# Patient Record
Sex: Female | Born: 1955 | Race: Black or African American | Hispanic: No | Marital: Single | Smoking: Current every day smoker
Health system: Southern US, Community
[De-identification: ages and names within clinical notes are randomized; demographics above are authoritative.]

## PROBLEM LIST (undated history)

## (undated) DIAGNOSIS — G459 Transient cerebral ischemic attack, unspecified: Secondary | ICD-10-CM

## (undated) DIAGNOSIS — I10 Essential (primary) hypertension: Secondary | ICD-10-CM

## (undated) DIAGNOSIS — E785 Hyperlipidemia, unspecified: Secondary | ICD-10-CM

## (undated) DIAGNOSIS — E119 Type 2 diabetes mellitus without complications: Secondary | ICD-10-CM

## (undated) HISTORY — PX: ABDOMINAL HYSTERECTOMY: SHX81

---

## 2007-07-19 ENCOUNTER — Ambulatory Visit: Payer: Self-pay

## 2008-09-19 ENCOUNTER — Ambulatory Visit: Payer: Self-pay

## 2008-10-05 ENCOUNTER — Ambulatory Visit: Payer: Self-pay

## 2010-01-09 ENCOUNTER — Ambulatory Visit: Payer: Self-pay | Admitting: Family Medicine

## 2011-01-21 ENCOUNTER — Ambulatory Visit: Payer: Self-pay | Admitting: Family Medicine

## 2012-02-24 ENCOUNTER — Ambulatory Visit: Payer: Self-pay | Admitting: Family Medicine

## 2012-05-05 ENCOUNTER — Emergency Department: Payer: Self-pay | Admitting: Emergency Medicine

## 2012-05-05 LAB — URINALYSIS, COMPLETE
Bilirubin,UR: NEGATIVE
Blood: NEGATIVE
Hyaline Cast: 5
Ketone: NEGATIVE
Nitrite: NEGATIVE
Ph: 6 (ref 4.5–8.0)
Squamous Epithelial: 6
WBC UR: 8 /HPF (ref 0–5)

## 2012-05-05 LAB — CBC
HGB: 14.1 g/dL (ref 12.0–16.0)
MCH: 27.7 pg (ref 26.0–34.0)
MCHC: 33.6 g/dL (ref 32.0–36.0)
RBC: 5.07 10*6/uL (ref 3.80–5.20)

## 2012-05-05 LAB — BASIC METABOLIC PANEL
Anion Gap: 10 (ref 7–16)
BUN: 17 mg/dL (ref 7–18)
Calcium, Total: 9.7 mg/dL (ref 8.5–10.1)
Chloride: 108 mmol/L — ABNORMAL HIGH (ref 98–107)
Co2: 24 mmol/L (ref 21–32)
Creatinine: 1.13 mg/dL (ref 0.60–1.30)
EGFR (African American): >60
Osmolality: 285 (ref 275–301)
Sodium: 142 mmol/L (ref 136–145)

## 2012-05-12 ENCOUNTER — Emergency Department: Payer: Self-pay | Admitting: Emergency Medicine

## 2013-03-22 ENCOUNTER — Ambulatory Visit: Payer: Self-pay

## 2014-04-20 LAB — COMPREHENSIVE METABOLIC PANEL
ALBUMIN: 3.7 g/dL (ref 3.4–5.0)
Alkaline Phosphatase: 95 U/L
Anion Gap: 8 (ref 7–16)
BILIRUBIN TOTAL: 0.3 mg/dL (ref 0.2–1.0)
BUN: 16 mg/dL (ref 7–18)
CALCIUM: 9.2 mg/dL (ref 8.5–10.1)
CHLORIDE: 108 mmol/L — AB (ref 98–107)
Co2: 24 mmol/L (ref 21–32)
Creatinine: 1 mg/dL (ref 0.60–1.30)
EGFR (African American): >60
EGFR (Non-African Amer.): >60
Glucose: 94 mg/dL (ref 65–99)
Osmolality: 280 (ref 275–301)
POTASSIUM: 4 mmol/L (ref 3.5–5.1)
SGOT(AST): 26 U/L (ref 15–37)
SGPT (ALT): 26 U/L
Sodium: 140 mmol/L (ref 136–145)
TOTAL PROTEIN: 8.2 g/dL (ref 6.4–8.2)

## 2014-04-20 LAB — CBC WITH DIFFERENTIAL/PLATELET
BASOS PCT: 0.5 %
Basophil #: 0 10*3/uL (ref 0.0–0.1)
EOS PCT: 1.2 %
Eosinophil #: 0.1 10*3/uL (ref 0.0–0.7)
HCT: 40.5 % (ref 35.0–47.0)
HGB: 13.4 g/dL (ref 12.0–16.0)
LYMPHS ABS: 4.5 10*3/uL — AB (ref 1.0–3.6)
Lymphocyte %: 53.2 %
MCH: 27.5 pg (ref 26.0–34.0)
MCHC: 33 g/dL (ref 32.0–36.0)
MCV: 84 fL (ref 80–100)
MONO ABS: 0.7 x10 3/mm (ref 0.2–0.9)
Monocyte %: 8.2 %
Neutrophil #: 3.1 10*3/uL (ref 1.4–6.5)
Neutrophil %: 36.9 %
Platelet: 191 10*3/uL (ref 150–440)
RBC: 4.85 10*6/uL (ref 3.80–5.20)
RDW: 14.4 % (ref 11.5–14.5)
WBC: 8.4 10*3/uL (ref 3.6–11.0)

## 2014-04-20 LAB — APTT: Activated PTT: 29.4 secs (ref 23.6–35.9)

## 2014-04-20 LAB — TROPONIN I

## 2014-04-20 LAB — PROTIME-INR
INR: 1
Prothrombin Time: 13.5 secs (ref 11.5–14.7)

## 2014-04-21 ENCOUNTER — Inpatient Hospital Stay: Payer: Self-pay | Admitting: Internal Medicine

## 2014-04-21 LAB — HEMOGLOBIN A1C: Hemoglobin A1C: 6 % (ref 4.2–6.3)

## 2014-04-22 ENCOUNTER — Ambulatory Visit: Payer: Self-pay | Admitting: Neurology

## 2014-04-22 LAB — LIPID PANEL
CHOLESTEROL: 168 mg/dL (ref 0–200)
HDL: 28 mg/dL — AB (ref 40–60)
LDL CHOLESTEROL, CALC: 103 mg/dL — AB (ref 0–100)
TRIGLYCERIDES: 185 mg/dL (ref 0–200)
VLDL Cholesterol, Calc: 37 mg/dL (ref 5–40)

## 2014-06-27 ENCOUNTER — Ambulatory Visit: Payer: Self-pay | Admitting: Family Medicine

## 2014-07-28 ENCOUNTER — Emergency Department
Admit: 2014-07-28 | Disposition: A | Discharge status: Home / Self Care | Payer: Self-pay | Admitting: Physician Assistant

## 2014-08-15 NOTE — H&P (Signed)
PATIENT NAME:  Judith Parker, Judith Parker MR#:  [id number] DATE OF BIRTH:  Sep 15, 1955  DATE OF ADMISSION:  04/21/2014  REFERRING PHYSICIAN: Lurena Joiner L. Shaune Pollack, MD  PRIMARY CARE PHYSICIAN: [facility].  CHIEF COMPLAINT: Left-sided weakness.   HISTORY OF PRESENT ILLNESS: A 59 year old [place] American female with a past medical history of essential hypertension; hyperlipidemia, unspecified; type 2 diabetes, non-insulin-requiring, uncomplicated; presenting with left-sided weakness. Describes acute onset of left-sided weakness about 24 to 48 hours prior to arrival to the hospital, mainly in the upper extremity and lower extremity with associated paresthesias. Symptoms resolved by the time she presented to the hospital; however, at urging of her family, decided to present to the hospital for further workup and evaluation. Now complaining of 1 day duration of right frontal headache described only as "pain," intensity 5 to 6/10, nonradiating, worse with bright lights. No relieving factors.  REVIEW OF SYSTEMS: CONSTITUTIONAL: Denies fevers, chills, fatigue, weakness.  EYES: Denies blurred vision, double vision, eye pain.  EARS, NOSE, THROAT: Denies tinnitus, ear pain, hearing loss.  RESPIRATORY: Denies cough, wheeze, shortness of breath.  CARDIOVASCULAR: Denies chest pain, palpitations, edema.  GASTROINTESTINAL: Denies nausea, vomiting, diarrhea, abdominal pain.  GENITOURINARY: Denies dysuria, hematuria.  ENDOCRINE: Denies nocturia or thyroid problems. HEMATOLOGY AND LYMPHATIC: Denies easy bruising and bleeding.  SKIN: Denies rashes or lesions.  MUSCULOSKELETAL: Denies pain in neck, back, shoulder, knees, hips, or arthritic symptoms.  NEUROLOGIC: Denies paralysis, paresthesias. However, positive for weakness as described above as well as paresthesias as described above. However, these have all improved now.  PSYCHIATRIC: Denies any anxiety or depressive symptoms.  Otherwise, full review of systems  performed by me is negative.   PAST MEDICAL HISTORY: Essential hypertension; hyperlipidemia, unspecified; type 2 diabetes, non-insulin-requiring, uncomplicated.   SOCIAL HISTORY: Positive for everyday tobacco use as well as occasional alcohol use. Denies any drug use.   FAMILY HISTORY: Denies any known cardiovascular or pulmonary disorders.   ALLERGIES: No known drug allergies.   HOME MEDICATIONS: Include aspirin 81 mg p.o. q. daily; lisinopril 10 mg p.o. q. daily; gemfibrozil 600 mg p.o. b.i.d.; fish oil, 1 capsule p.o. q. daily; Centrum Silver multivitamin, 1 tab p.o. q. daily. Of note, she has been out of her blood pressure medications for 5 days.   PHYSICAL EXAMINATION:  VITAL SIGNS: Temperature 98.4, heart rate 73, respirations 20, blood pressure 179/75, saturating 96% on room air. Weight 97.1 kg, BMI of 31.6.  GENERAL: Well-nourished, well-developed, Caucasian female currently in no acute distress.  HEAD: Normocephalic, atraumatic.  EYES: Pupils equal, round, reactive to light. Extraocular muscles intact. No scleral icterus.  MOUTH: Moist mucosal membranes. Dentition intact. No abscess noted. EARS, NOSE, THROAT: Clear without exudates. No external lesions.  NECK: Supple. No thyromegaly. No nodules. No JVD.  PULMONARY: Clear to auscultation bilaterally without wheezes, rales, or rhonchi. No use of accessory muscles. Good respiratory effort. CHEST: Nontender to palpation.  CARDIOVASCULAR: S1, S2, regular rate and rhythm. No murmurs, rubs, or gallops. No edema. Pedal pulses 2+ bilaterally. GASTROINTESTINAL: Soft, nontender, nondistended. No masses. No hepatosplenomegaly.  MUSCULOSKELETAL: No swelling, clubbing, or edema. Range of motion full in all extremities.  NEUROLOGIC: Cranial nerves II through XII intact. No gross focal neurological deficits. Sensation intact. Reflexes intact. pronator drift within normal limits. Strength 5/5 in both proximal and distal flexion and extension in all  extremities.  SKIN: No ulceration, lesions, rashes, or cyanosis. Skin warm, dry. Turgor intact.  PSYCHIATRIC: Mood, affect within normal limits. Patient awake, alert, oriented x 3. Insight  and judgment intact.   LABORATORY DATA: CT, head, performed reveals subacute chronic right basal ganglia lacunar infarct. Remainder of laboratory data: Sodium 140, potassium 4, chloride of 108, bicarbonate 24, BUN 16, creatinine 1. LFTs within normal limits. WBC 8.4, hemoglobin 13.4, platelets of 191,000.   ASSESSMENT AND PLAN: A 59 year old [place] American female with a history of hypertension; hyperlipidemia; type 2 diabetes, non-insulin-requiring; presenting with left-sided weakness.  1.  Subacute right basal ganglia lacunar infarct. Initiate aspirin and statin therapy. Increase aspirin dose to 325 instead of 81 mg. Neurologic checks q. 4 hours. Will get an MRI, carotid Dopplers, lipid panel, hemoglobin A1c, transthoracic echocardiogram, and searching for further risk-factor modification.  2.  Type 2 diabetes, uncomplicated. Hold p.o. agents. Add insulin sliding scale with q. 6 hour Accu-Cheks.  3.  Hypertension, essential. Restart her home medications.  4.  Venous thromboembolism prophylaxis with sequential compression devices.  CODE STATUS: The patient is full code.   TIME SPENT: 45 minutes.    ____________________________ Cletis Athensavid K. Katharina Jehle, MD dkh:ST D: 04/21/2014 01:39:56 ET T: 04/21/2014 01:59:18 ET JOB#: [id number]  cc: Cletis Athensavid K. Isabeau Mccalla, MD, <Dictator> Makyiah Lie Synetta ShadowK Karaline Buresh MD ELECTRONICALLY SIGNED 04/21/2014 22:11

## 2014-08-19 NOTE — Consult Note (Signed)
PATIENT NAME:  Judith Parker, Judith Parker MR#:  [id number] DATE OF BIRTH:  January 12, 1956  DATE OF CONSULTATION:  04/22/2014  REFERRING PHYSICIAN:   CONSULTING PHYSICIAN:  Pauletta BrownsYuriy Joandry Slagter, MD  REASON FOR CONSULTATION: Stroke.   HISTORY OF PRESENT ILLNESS: This is a 59 year old female with a past medical history of hypertension, hyperlipidemia and type 2 diabetes who is presenting with left-sided weakness. The patient was found to have right basal ganglia and multiple right MCA embolic infarcts. The patient is status post CTA and was found to have distal MCA stenosis on the right, status post carotid ultrasound with no hemodynamic stenosis. Currently denies any fever or chills on review of systems. Denies any blurred vision. Denies any tinnitus. Denies any chest pain. Denies any abdominal pain. Denies any weakness on one side of the body compared to the other.   HOME MEDICATIONS: Reviewed.   PAST MEDICAL HISTORY: Hypertension, hyperlipidemia, type 2 diabetes.   PHYSICAL EXAMINATION:  NEUROLOGIC: The patient is alert, awake, oriented to time, place, location, the reason why she is in the hospital. Facial sensation intact. Facial motor is intact. Tongue is midline. Uvula elevates symmetrically. Shoulder shrug intact. Motor 5/5 bilateral upper and lower extremities. Extraocular movements intact. Sensation is intact. The patient is close to her baseline.   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   PLAN: Dual antiplatelet therapy with aspirin and Plavix for 90 days and then monotherapy. Follow up with neurology as an outpatient. Discharge planning from a neurological standpoint.   Thank you. It was a pleasure seeing this patient.    ____________________________ Pauletta BrownsYuriy Shoaib Siefker, MD yz:JT D: 04/22/2014 13:22:24 ET T: 04/22/2014 15:44:16 ET JOB#: [id number]  cc: Pauletta BrownsYuriy Jalexis Breed, MD, <Dictator> Pauletta BrownsYURIY Tyreka Henneke MD ELECTRONICALLY  SIGNED 05/08/2014 21:10

## 2014-08-19 NOTE — Discharge Summary (Signed)
PATIENT NAME:  Judith Parker, Judith Parker MR#:  [id number] DATE OF BIRTH:  [birth year]  DATE OF ADMISSION:  04/21/2014 DATE OF DISCHARGE:  04/22/2014  CHIEF COMPLAINT AT THE TIME OF ADMISSION: Left-sided weakness.   ADMITTING DIAGNOSES: 1. Subacute right basal ganglia lacunar infarct.  2. Diabetes type 2.  3. Hypertension.   DISCHARGE DIAGNOSES: 1. Acute right middle cerebral artery embolic stroke.  2. Hyperlipidemia.   PROCEDURES: None.   CONSULTATIONS: Neurology, Pauletta Browns, MD   HOSPITAL COURSE: The patient is a 59 year old African American female who came in to the ED with a chief complaint of left-sided weakness. She has multiple risk factors. She was describing left-sided weakness for about 24 to 48 hours prior to her arrival to the hospital. Please review history and physical for details. The patient was also complaining of right-sided frontal headache 5 out of 10 to 6 out of 10, nonradiating, worse with bright lights. CAT scan of the head has revealed a subacute right basal ganglia lacunar infarct. The patient was started on aspirin and statin. Neurologic checks were ordered. MRA of the brain, carotid Dopplers, and 2-D echocardiogram were ordered.   MRA the brain has revealed a few acute subcentimeter infarctions in the right middle cerebral artery consistent with micro-embolic infarctions with no hemorrhage, swelling or mass effect. This was discussed with the on-call neurologist, Dr. Loretha Brasil, regarding the need for TEE, transesophageal echocardiogram. Dr. Loretha Brasil has evaluated this patient. The patient's symptoms have resolved. The patient was found to have distal MCA stenosis in the right status post carotid ultrasound with no hemodynamic stenosis. The patient denies any chest pain, tinnitus, blurry vision, or any weakness. He has recommended to discharge the patient with aspirin and Plavix 75 mg for 90 days, and then subsequently monotherapy and follow up with neurology as an outpatient.  The patient felt comfortable to be discharged home. She was evaluated by physical therapy and since she was able to take shower without any assistance, they have recommended no physical therapy needs. She was discharged to home in stable condition.   PHYSICAL EXAMINATION: VITAL SIGNS: On 04/22/2014: Temperature 97.5, pulse 58, respirations 18, blood pressure 146/77, pulse oximetry 97%. GENERAL: Patient was not in any acute distress. Moderately built and nourished.  HEENT: Normocephalic, atraumatic. Pupils are equally reactive to light and accommodation. No scleral icterus. No conjunctival injection. No sinus tenderness. No postnasal drip. Moist mucous membranes.  NECK: Supple. No JVD. No thyromegaly. Range of motion is intact.  LUNGS: Clear to auscultation bilaterally. No accessory muscle use and no anterior chest wall tenderness on palpation.  CARDIAC: S1, S2 normal. Regular rate and rhythm. No murmurs.  GASTROINTESTINAL: Soft. Bowel sounds are positive in all 4 quadrants. Nontender, nondistended. No hepatosplenomegaly. No masses felt.  NEUROLOGIC: Awake, alert, oriented x3. Cranial nerves II through XII are grossly intact. Motor and sensory are intact. Reflexes are 2+. Gait normal.  EXTREMITIES: No cyanosis. No clubbing.  SKIN: Warm to touch. Normal turgor. No rashes. No lesions.  MUSCULOSKELETAL: No joint effusion, tenderness or erythema.  PSYCHIATRIC: Normal mood and affect.   LABORATORY DATA AND IMAGING STUDIES: MRI of the brain: A few acute subcentimeter infarctions in the right middle cerebral artery territory consistent with micro-embolic infarction. No hemorrhage. No swelling or mass effect. Chronic small vessel ischemic changes throughout the brain. Echocardiogram: Left ventricular ejection fraction of 60%  to 65%. Normal global left ventricular systolic function. Mildly increased LV septal thickness and ventricular posterior wall thickness. CT angiogram of the head has revealed  moderate  attenuation of the distal right MCA branch versus worse on the right. Corresponds to the area of acute infarct. Mild distal small vessel disease. Acute punctate cortical infarct in the high posterior right frontal lobe. LDL 103 as of 04/22/2014.   She is full code.   DISCHARGE MEDICATIONS: Bayer aspirin 81 mg p.o. once daily,  atorvastatin 20 mg p.o. once daily, lisinopril 10 mg 1 pill p.o. once daily, fish oil 1 capsule p.o. once daily, Centrum Silver 1 tablet p.o. once daily, gemfibrozil 600 mg p.o. 2 times a day.   Low sodium, low fat diet, and also carb-controlled diet.   ACTIVITY: As tolerated.   Follow up with primary care physician in 1 week, with neurology in 2 to 4 weeks, with outpatient diabetic clinic in 4 weeks.   The diagnosis and plan of care was discussed in detail with the patient. She verbalized understanding of the plan.   TOTAL TIME SPENT ON DISCHARGE AND COORDINATION OF CARE: 45 minutes.    ____________________________ Ramonita LabAruna Joshau Code, MD ag:lm D: 04/28/2014 19:50:44 ET T: 04/29/2014 04:35:07 ET JOB#: [id number]  cc: Ramonita LabAruna Malvin Morrish, MD, <Dictator> Pauletta BrownsYuriy Zeylikman, MD (Dictation Anomaly) Ramonita LabARUNA Herschel Fleagle MD ELECTRONICALLY SIGNED 04/30/2014 22:34

## 2015-11-11 ENCOUNTER — Emergency Department (HOSPITAL_COMMUNITY): Payer: Medicaid Other

## 2015-11-11 ENCOUNTER — Emergency Department (HOSPITAL_COMMUNITY)
Admission: EM | Admit: 2015-11-11 | Discharge: 2015-11-11 | Disposition: A | Discharge status: Home / Self Care | Payer: Medicaid Other | Attending: Emergency Medicine | Admitting: Emergency Medicine

## 2015-11-11 ENCOUNTER — Encounter (HOSPITAL_COMMUNITY): Payer: Self-pay

## 2015-11-11 DIAGNOSIS — E860 Dehydration: Secondary | ICD-10-CM | POA: Diagnosis not present

## 2015-11-11 DIAGNOSIS — I10 Essential (primary) hypertension: Secondary | ICD-10-CM | POA: Diagnosis not present

## 2015-11-11 DIAGNOSIS — F17210 Nicotine dependence, cigarettes, uncomplicated: Secondary | ICD-10-CM | POA: Diagnosis not present

## 2015-11-11 DIAGNOSIS — Z8673 Personal history of transient ischemic attack (TIA), and cerebral infarction without residual deficits: Secondary | ICD-10-CM | POA: Diagnosis not present

## 2015-11-11 DIAGNOSIS — Z7982 Long term (current) use of aspirin: Secondary | ICD-10-CM | POA: Diagnosis not present

## 2015-11-11 DIAGNOSIS — R55 Syncope and collapse: Secondary | ICD-10-CM | POA: Diagnosis present

## 2015-11-11 DIAGNOSIS — E119 Type 2 diabetes mellitus without complications: Secondary | ICD-10-CM | POA: Diagnosis not present

## 2015-11-11 HISTORY — DX: Essential (primary) hypertension: I10

## 2015-11-11 HISTORY — DX: Hyperlipidemia, unspecified: E78.5

## 2015-11-11 HISTORY — DX: Transient cerebral ischemic attack, unspecified: G45.9

## 2015-11-11 HISTORY — DX: Type 2 diabetes mellitus without complications: E11.9

## 2015-11-11 LAB — CBC WITH DIFFERENTIAL/PLATELET
BASOS PCT: 0 %
Basophils Absolute: 0 10*3/uL (ref 0.0–0.1)
EOS PCT: 1 %
Eosinophils Absolute: 0 10*3/uL (ref 0.0–0.7)
HEMATOCRIT: 39 % (ref 36.0–46.0)
HEMOGLOBIN: 13 g/dL (ref 12.0–15.0)
LYMPHS PCT: 36 %
Lymphs Abs: 2.9 10*3/uL (ref 0.7–4.0)
MCH: 27.6 pg (ref 26.0–34.0)
MCHC: 33.3 g/dL (ref 30.0–36.0)
MCV: 82.8 fL (ref 78.0–100.0)
MONO ABS: 0.8 10*3/uL (ref 0.1–1.0)
Monocytes Relative: 10 %
Neutro Abs: 4.2 10*3/uL (ref 1.7–7.7)
Neutrophils Relative %: 53 %
Platelets: 167 10*3/uL (ref 150–400)
RBC: 4.71 MIL/uL (ref 3.87–5.11)
RDW: 14.3 % (ref 11.5–15.5)
WBC: 8 10*3/uL (ref 4.0–10.5)

## 2015-11-11 LAB — I-STAT CHEM 8, ED
BUN: 12 mg/dL (ref 6–20)
CHLORIDE: 109 mmol/L (ref 101–111)
CREATININE: 1.2 mg/dL — AB (ref 0.44–1.00)
Calcium, Ion: 1.02 mmol/L — ABNORMAL LOW (ref 1.12–1.23)
Glucose, Bld: 97 mg/dL (ref 65–99)
HEMATOCRIT: 42 % (ref 36.0–46.0)
Hemoglobin: 14.3 g/dL (ref 12.0–15.0)
Potassium: 4.2 mmol/L (ref 3.5–5.1)
Sodium: 141 mmol/L (ref 135–145)
TCO2: 19 mmol/L (ref 0–100)

## 2015-11-11 MED ORDER — SODIUM CHLORIDE 0.9 % IV BOLUS (SEPSIS)
1000.0000 mL | Freq: Once | INTRAVENOUS | Status: AC
  Administered 2015-11-11: 1000 mL via INTRAVENOUS

## 2015-11-11 NOTE — ED Provider Notes (Signed)
[facility]-EMERGENCY DEPT Provider Note   CSN: [id number] Arrival date & time: 11/11/15  1217  First Provider Contact:  First MD Initiated Contact with Patient 11/11/15 1425        History   Chief Complaint Chief Complaint  Patient presents with  . Loss of Consciousness    HPI Judith Parker is a 60 y.o. female.  Patient is a 60 year old female with a history of hypertension, hyperlipidemia, TIA and diabetes presenting today after a syncopal event. 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 She states she went outside to smoke a cigarette and when she came back and she started feeling very hot. She states she started to feel lightheaded and dizzy and the next thing she remembers is waking up on the floor. She has been trying to drink water while at work but states she gets busy. She denies any chest pain, palpitations, shortness of breath prior or after the event. No nausea or vomiting. She denies any unilateral weakness, numbness or dizziness at this time. She drinks alcohol occasionally but not heavily.   The history is provided by the patient.  Loss of Consciousness   This is a new problem. The current episode started 1 to 2 hours ago. The problem occurs constantly. The problem has been resolved. She lost consciousness for a period of less than one minute. The problem is associated with standing up (being in a very hot environment). Associated symptoms include light-headedness. Pertinent negatives include bladder incontinence, bowel incontinence, chest pain, fever, headaches, nausea, palpitations, seizures, slurred speech, visual change and vomiting. She has tried nothing for the symptoms. Her past medical history is significant for DM, HTN and TIA.    Past Medical History:  Diagnosis Date  . Diabetes mellitus without complication (HCC)   . Hyperlipidemia   . Hypertension   . TIA (transient ischemic attack)     There are no active problems to display for this  patient.   Past Surgical History:  Procedure Laterality Date  . ABDOMINAL HYSTERECTOMY      OB History    Gravida Para Term Preterm AB Living   2 2           SAB TAB Ectopic Multiple Live Births                   Home Medications    Prior to Admission medications   Medication Sig Start Date End Date Taking? Authorizing Provider  aspirin 81 MG tablet Take 81 mg by mouth daily.   Yes Historical Provider, MD  gemfibrozil (LOPID) 600 MG tablet Take 600 mg by mouth 2 (two) times daily before a meal.   Yes Historical Provider, MD  lisinopril (PRINIVIL,ZESTRIL) 10 MG tablet Take 10 mg by mouth daily.   Yes Historical Provider, MD    Family History Family History  Problem Relation Age of Onset  . Hyperlipidemia Mother   . Stroke Mother   . Hyperlipidemia Father   . Hyperlipidemia Sister   . Diabetes Sister   . Hyperlipidemia Brother   . Diabetes Brother     Social History Social History  Substance Use Topics  . Smoking status: Current Every Day Smoker    Packs/day: 0.50    Types: Cigarettes  . Smokeless tobacco: Never Used  . Alcohol use No     Allergies   Review of patient's allergies indicates no known allergies.   Review of Systems Review of Systems  Constitutional: Negative for fever.  Cardiovascular: Positive  for syncope. Negative for chest pain and palpitations.  Gastrointestinal: Negative for bowel incontinence, nausea and vomiting.  Genitourinary: Negative for bladder incontinence.  Neurological: Positive for light-headedness. Negative for seizures and headaches.  All other systems reviewed and are negative.    Physical Exam Updated Vital Signs BP 135/76 (BP Location: Right Arm)   Pulse 73   Temp 98.3 F (36.8 C) (Oral)   Resp 18   SpO2 98%   Physical Exam  Constitutional: She is oriented to person, place, and time. She appears well-developed and well-nourished. No distress.  HENT:  Head: Normocephalic and atraumatic.  Eyes: EOM are normal.  Pupils are equal, round, and reactive to light.  Cardiovascular: Normal rate, regular rhythm, normal heart sounds and intact distal pulses.  Exam reveals no friction rub.   No murmur heard. Pulmonary/Chest: Effort normal and breath sounds normal. She has no wheezes. She has no rales.  Abdominal: Soft. Bowel sounds are normal. She exhibits no distension. There is no tenderness. There is no rebound and no guarding.  Musculoskeletal: Normal range of motion. She exhibits no tenderness.  No edema  Neurological: She is alert and oriented to person, place, and time. She has normal strength. No cranial nerve deficit or sensory deficit. Coordination normal.  5/5 strength in all 4 extremities. Normal heel-to-shin testing. No visual field cuts.  Skin: Skin is warm and dry. No rash noted.  Psychiatric: She has a normal mood and affect. Her behavior is normal.  Nursing note and vitals reviewed.    ED Treatments / Results  Labs (all labs ordered are listed, but only abnormal results are displayed) Labs Reviewed  I-STAT CHEM 8, ED - Abnormal; Notable for the following:       Result Value   Creatinine, Ser 1.20 (*)    Calcium, Ion 1.02 (*)    All other components within normal limits  CBC WITH DIFFERENTIAL/PLATELET    EKG  EKG Interpretation  Date/Time:  Monday November 11 2015 12:22:03 EDT Ventricular Rate:  78 PR Interval:    QRS Duration: 81 QT Interval:  403 QTC Calculation: 459 R Axis:   66 Text Interpretation:  Sinus rhythm Ventricular premature complex No significant change since last tracing Confirmed by Anitra Lauth  MD, Alphonzo Lemmings ([id number]) on 11/11/2015 12:30:25 PM       Radiology Dg Chest 2 View  Result Date: 11/11/2015 CLINICAL DATA:  Syncope today. EXAM: CHEST  2 VIEW COMPARISON:  None. FINDINGS: Cardiomediastinal silhouette is mildly enlarged. Mediastinal contours appear intact. There is no evidence of focal airspace consolidation, pleural effusion or pneumothorax. Low lung volumes.  Osseous structures are without acute abnormality. Soft tissues are grossly normal. IMPRESSION: Low lung volumes with mildly enlarged cardiac silhouette. Otherwise no evidence of acute cardiopulmonary disease. Electronically Signed   By: Ted Mcalpine M.D.   On: 11/11/2015 14:24  Ct Head Wo Contrast  Result Date: 11/11/2015 CLINICAL DATA:  Sudden onset of generalized weakness with near syncopal episode. No known injury. History of hypertension, diabetes and TIA. EXAM: CT HEAD WITHOUT CONTRAST TECHNIQUE: Contiguous axial images were obtained from the base of the skull through the vertex without intravenous contrast. COMPARISON:  None. FINDINGS: Brain: There is no evidence of acute intracranial hemorrhage, mass lesion, brain edema or extra-axial fluid collection. The ventricles and subarachnoid spaces are appropriately sized for age. There is mild to moderate periventricular white matter disease with asymmetric involvement in the right frontal lobe. Vascular: No hyperdense vessel or unexpected calcification. Skull: Negative for fracture or focal  lesion. Sinuses/Orbits: The visualized paranasal sinuses and mastoid air cells are clear. Other: None. IMPRESSION: 1. 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 2. No evidence of acute cortical based infarct or hemorrhage. Electronically Signed   By: Carey Bullocks M.D.   On: 11/11/2015 15:02   Procedures Procedures (including critical care time)  Medications Ordered in ED Medications  sodium chloride 0.9 % bolus 1,000 mL (1,000 mLs Intravenous New Bag/Given 11/11/15 1341)     Initial Impression / Assessment and Plan / ED Course  I have reviewed the triage vital signs and the nursing notes.  Pertinent labs & imaging results that were available during my care of the patient were reviewed by me and considered in my medical decision making (see chart for details).  Clinical Course    Patient presenting today after a syncopal episode. Feel the syncopal episode was most likely related to heat and vagal response. Family states in the past when she's passed out she had a TIA however patient has no focal weakness, slurred speech and no seizure-like activity during the event. She denies any chest pain or shortness of breath. She drinks alcohol occasionally but has been trying to drink water while at work however concern for possible dehydration as where she works its over 100. She is well-appearing at this time and has no complaints. EKG without any concerning findings such as WPW, Brugada's or prolonged QT.  Today creatinine is slightly elevated at 1.2 from her baseline of less than 1 with normal CBC. Feel patient is likely dehydrated. She does take blood pressure medication and blood pressures here been in the low 100s. Patient given IV fluids.  5:34 PM Patient feeling much better ambulating without difficulty. She was discharged home. CT negative for acute stroke.  Final Clinical Impressions(s) / ED Diagnoses   Final diagnoses:  Syncope, unspecified syncope type  Dehydration    New Prescriptions Discharge Medication List as of 11/11/2015  3:57 PM       Gwyneth Sprout, MD 11/11/15 1735

## 2015-11-11 NOTE — ED Triage Notes (Signed)
Pt arrives EMS with c/o LOC at work and woke on floor. Hx of similar ep[isodes with TIA last 2 months ago. A&O x 3.

## 2015-11-11 NOTE — ED Notes (Signed)
Family at bedside. 

## 2015-11-11 NOTE — ED Notes (Signed)
MD at bedside. 

## 2015-11-11 NOTE — ED Notes (Signed)
Pt states she understands instructions and is home stable with steady gait. Home with family.

## 2015-11-11 NOTE — ED Notes (Signed)
Pt oob to br with steady,rapid and completely unassisted gait.

## 2015-11-11 NOTE — ED Notes (Signed)
Lab at bedside

## 2015-12-26 ENCOUNTER — Other Ambulatory Visit: Payer: Self-pay | Admitting: Family Medicine

## 2015-12-26 DIAGNOSIS — R928 Other abnormal and inconclusive findings on diagnostic imaging of breast: Secondary | ICD-10-CM

## 2015-12-31 ENCOUNTER — Inpatient Hospital Stay
Admission: RE | Admit: 2015-12-31 | Discharge: 2015-12-31 | Disposition: A | Discharge status: Home / Self Care | Payer: Self-pay | Source: Ambulatory Visit | Attending: *Deleted | Admitting: *Deleted

## 2015-12-31 ENCOUNTER — Other Ambulatory Visit: Payer: Self-pay | Admitting: *Deleted

## 2015-12-31 DIAGNOSIS — Z9289 Personal history of other medical treatment: Secondary | ICD-10-CM

## 2016-01-17 ENCOUNTER — Other Ambulatory Visit: Payer: Self-pay | Admitting: Family Medicine

## 2016-01-17 DIAGNOSIS — R928 Other abnormal and inconclusive findings on diagnostic imaging of breast: Secondary | ICD-10-CM

## 2016-07-06 ENCOUNTER — Encounter (INDEPENDENT_AMBULATORY_CARE_PROVIDER_SITE_OTHER): Payer: Self-pay

## 2016-07-06 ENCOUNTER — Ambulatory Visit: Payer: Self-pay | Attending: Oncology

## 2016-07-06 VITALS — BP 120/70 | HR 70 | Temp 98.0°F | Ht <= 58 in | Wt 226.3 lb

## 2016-07-06 DIAGNOSIS — N63 Unspecified lump in unspecified breast: Secondary | ICD-10-CM

## 2016-07-06 NOTE — Progress Notes (Signed)
Subjective:     Patient ID: Judith Parker, female   DOB: April 28, 1955, 61 y.o.   MRN: [id number]  HPI   Review of Systems     Objective:   Physical Exam  Pulmonary/Chest: Right breast exhibits no inverted nipple, no mass, no nipple discharge, no skin change and no tenderness. Left breast exhibits no inverted nipple, no mass, no nipple discharge, no skin change and no tenderness. Breasts are symmetrical.       Assessment:     61 [facility].  Patient had a screening mammogram 10/17/15 with Birads 0 result for retro areolar asymmetry.  She did not return for additional views. Referred to [facility] by [facility]   Patient screened, and meets [facility] eligibility.  Patient does not have insurance, Medicare or Medicaid.  Handout given on Affordable Care Act.  Instructed patient on breast self-exam using teach back method.  CBE unremarkable.  No mass or lump palpated.    Plan:     Scheduled for [facility] right mammogram, and right breast ultrasound on 07/10/16.

## 2016-07-10 ENCOUNTER — Ambulatory Visit
Admission: RE | Admit: 2016-07-10 | Discharge: 2016-07-10 | Disposition: A | Discharge status: Home / Self Care | Payer: Self-pay | Source: Ambulatory Visit | Attending: Oncology | Admitting: Oncology

## 2016-07-10 ENCOUNTER — Ambulatory Visit
Admission: RE | Admit: 2016-07-10 | Discharge: 2016-07-10 | Disposition: A | Discharge status: Home / Self Care | Payer: Self-pay | Source: Ambulatory Visit | Attending: Family Medicine | Admitting: Family Medicine

## 2016-07-10 DIAGNOSIS — N63 Unspecified lump in unspecified breast: Secondary | ICD-10-CM

## 2016-07-22 NOTE — Progress Notes (Signed)
Letter mailed from [facility] to notify of normal mammogram results.  Patient to return in one year for annual screening.  Copy to [facility]. 

## 2016-09-28 ENCOUNTER — Ambulatory Visit: Payer: Self-pay

## 2016-09-30 ENCOUNTER — Other Ambulatory Visit: Payer: Self-pay

## 2016-09-30 ENCOUNTER — Ambulatory Visit
Admission: RE | Admit: 2016-09-30 | Discharge: 2016-09-30 | Disposition: A | Discharge status: Home / Self Care | Payer: Self-pay | Source: Ambulatory Visit | Attending: Oncology | Admitting: Oncology

## 2016-09-30 ENCOUNTER — Ambulatory Visit: Payer: Self-pay | Attending: Oncology

## 2016-09-30 DIAGNOSIS — Z0000 Encounter for general adult medical examination without abnormal findings: Secondary | ICD-10-CM

## 2016-10-18 NOTE — Progress Notes (Signed)
Letter mailed from [facility] to notify of normal mammogram results.  Patient to return in one year for annual screening.  Copy to [facility]. 

## 2017-09-20 ENCOUNTER — Other Ambulatory Visit: Payer: Self-pay | Admitting: Family Medicine

## 2017-09-20 DIAGNOSIS — Z1231 Encounter for screening mammogram for malignant neoplasm of breast: Secondary | ICD-10-CM

## 2017-10-14 ENCOUNTER — Ambulatory Visit
Admission: RE | Admit: 2017-10-14 | Discharge: 2017-10-14 | Disposition: A | Discharge status: Home / Self Care | Payer: Medicaid Other | Source: Ambulatory Visit | Attending: Family Medicine | Admitting: Family Medicine

## 2017-10-14 DIAGNOSIS — Z1231 Encounter for screening mammogram for malignant neoplasm of breast: Secondary | ICD-10-CM

## 2019-03-21 ENCOUNTER — Other Ambulatory Visit: Payer: Self-pay | Admitting: Family Medicine

## 2019-03-21 DIAGNOSIS — Z20822 Contact with and (suspected) exposure to covid-19: Secondary | ICD-10-CM

## 2019-03-23 LAB — NOVEL CORONAVIRUS, NAA: SARS-CoV-2, NAA: NOT DETECTED

## 2019-03-27 ENCOUNTER — Telehealth: Payer: Self-pay | Admitting: Family Medicine

## 2019-03-27 NOTE — Telephone Encounter (Incomplete)
° °  Pt rec neg COVID test rsutls

## 2019-05-17 ENCOUNTER — Other Ambulatory Visit: Payer: Self-pay | Admitting: Family Medicine

## 2019-05-17 DIAGNOSIS — Z1231 Encounter for screening mammogram for malignant neoplasm of breast: Secondary | ICD-10-CM

## 2019-06-12 ENCOUNTER — Encounter: Payer: Self-pay | Admitting: *Deleted

## 2019-06-12 NOTE — Progress Notes (Signed)
Tried to call patient to prescreen for her [facility] appointment.  Phone number is not working.

## 2019-06-13 ENCOUNTER — Other Ambulatory Visit: Payer: Self-pay

## 2019-06-13 ENCOUNTER — Ambulatory Visit: Payer: Medicaid Other | Attending: Oncology

## 2019-06-13 ENCOUNTER — Ambulatory Visit
Admission: RE | Admit: 2019-06-13 | Discharge: 2019-06-13 | Disposition: A | Discharge status: Home / Self Care | Payer: Medicaid Other | Source: Ambulatory Visit | Attending: Oncology | Admitting: Oncology

## 2019-06-13 VITALS — BP 174/70 | HR 58 | Temp 96.8°F | Ht 69.5 in | Wt 222.0 lb

## 2019-06-13 DIAGNOSIS — Z0000 Encounter for general adult medical examination without abnormal findings: Secondary | ICD-10-CM

## 2019-06-13 NOTE — Progress Notes (Signed)
  Subjective:     Patient ID: Judith Parker, female   DOB: 1955-12-30, 64 y.o.   MRN: [id number]  HPI   Review of Systems     Objective:   Physical Exam Chest:     Breasts:        Right: No swelling, bleeding, inverted nipple, mass, nipple discharge, skin change or tenderness.        Left: No swelling, bleeding, inverted nipple, mass, nipple discharge, skin change or tenderness.        Assessment:     64 year old patient presents for B[facility]CP clinic visit.  Patient screened, and meets BCCCP eligibility.  Patient does not have insurance, Medicare or Medicaid. Instructed patient on breast self awareness using teach back method.  Clinical breast exam unremarkable.  No mass or lump palpated.  Patient works at [employer].   Risk Assessment    Risk Scores      06/13/2019   Last edited by: Jim Like, RN   5-year risk: 1.2 %   Lifetime risk: 4.9 %             Plan:     Sent for bilateral screening mammogram.

## 2019-06-16 NOTE — Progress Notes (Signed)
Letter mailed from [facility] to notify of normal mammogram results.  Patient to return in one year for annual screening.  Copy to [facility]. 

## 2020-05-13 ENCOUNTER — Other Ambulatory Visit: Payer: Self-pay | Admitting: Family Medicine

## 2020-05-13 DIAGNOSIS — Z1231 Encounter for screening mammogram for malignant neoplasm of breast: Secondary | ICD-10-CM

## 2020-06-13 ENCOUNTER — Other Ambulatory Visit: Payer: Self-pay

## 2020-06-13 ENCOUNTER — Ambulatory Visit
Admission: RE | Admit: 2020-06-13 | Discharge: 2020-06-13 | Disposition: A | Discharge status: Home / Self Care | Payer: Self-pay | Source: Ambulatory Visit | Attending: Family Medicine | Admitting: Family Medicine

## 2020-06-13 DIAGNOSIS — Z1231 Encounter for screening mammogram for malignant neoplasm of breast: Secondary | ICD-10-CM | POA: Insufficient documentation

## 2021-06-03 ENCOUNTER — Other Ambulatory Visit: Payer: Self-pay | Admitting: Family Medicine

## 2021-06-03 DIAGNOSIS — Z1231 Encounter for screening mammogram for malignant neoplasm of breast: Secondary | ICD-10-CM

## 2021-07-09 ENCOUNTER — Other Ambulatory Visit: Payer: Self-pay

## 2021-07-09 ENCOUNTER — Ambulatory Visit
Admission: RE | Admit: 2021-07-09 | Discharge: 2021-07-09 | Disposition: A | Discharge status: Home / Self Care | Payer: Medicare Other | Source: Ambulatory Visit | Attending: Family Medicine | Admitting: Family Medicine

## 2021-07-09 DIAGNOSIS — Z1231 Encounter for screening mammogram for malignant neoplasm of breast: Secondary | ICD-10-CM | POA: Diagnosis present

## 2022-06-01 ENCOUNTER — Other Ambulatory Visit: Payer: Self-pay | Admitting: Family Medicine

## 2022-06-01 DIAGNOSIS — Z1231 Encounter for screening mammogram for malignant neoplasm of breast: Secondary | ICD-10-CM

## 2022-08-13 ENCOUNTER — Ambulatory Visit
Admission: RE | Admit: 2022-08-13 | Discharge: 2022-08-13 | Disposition: A | Discharge status: Home / Self Care | Payer: 59 | Source: Ambulatory Visit | Attending: Family Medicine | Admitting: Family Medicine

## 2022-08-13 DIAGNOSIS — Z1231 Encounter for screening mammogram for malignant neoplasm of breast: Secondary | ICD-10-CM | POA: Insufficient documentation

## 2022-08-20 ENCOUNTER — Ambulatory Visit: Payer: 59 | Attending: Cardiology | Admitting: Cardiology

## 2022-08-20 ENCOUNTER — Encounter: Payer: Self-pay | Admitting: Cardiology

## 2022-08-20 VITALS — BP 128/66 | HR 67 | Ht 69.0 in | Wt 216.6 lb

## 2022-08-20 DIAGNOSIS — IMO0001 Reserved for inherently not codable concepts without codable children: Secondary | ICD-10-CM

## 2022-08-20 DIAGNOSIS — I10 Essential (primary) hypertension: Secondary | ICD-10-CM

## 2022-08-20 DIAGNOSIS — I4510 Unspecified right bundle-branch block: Secondary | ICD-10-CM | POA: Diagnosis not present

## 2022-08-20 DIAGNOSIS — F17200 Nicotine dependence, unspecified, uncomplicated: Secondary | ICD-10-CM

## 2022-08-20 NOTE — Progress Notes (Signed)
Cardiology Office Note:    Date:  08/20/2022   ID:  Judith Parker, DOB January 29, 1956, MRN [id number]  PCP:  Oswaldo Conroy, MD   [facility] Providers Cardiologist:  Debbe Odea, MD     Referring MD: Oswaldo Conroy, MD   No chief complaint on file.   History of Present Illness:    Judith Parker is a 67 y.o. female with a hx of hypertension, hyperlipidemia, diabetes, current smoker x 40+ years who presents due to right bundle branch block.  Patient saw primary care physician recently, echocardiogram was obtained showing a bundle branch block.  She denies chest pain or shortness of breath.  Compliant with her medications as prescribed, her blood pressure is usually well-controlled.  Denies any family history of heart disease.  Denies dizziness, presyncope or syncope.  Past Medical History:  Diagnosis Date   Diabetes mellitus without complication (HCC)    Hyperlipidemia    Hypertension    TIA (transient ischemic attack)     Past Surgical History:  Procedure Laterality Date   ABDOMINAL HYSTERECTOMY      Current Medications: Current Meds  Medication Sig   amLODipine (NORVASC) 10 MG tablet Take 10 mg by mouth daily.   aspirin 81 MG tablet Take 81 mg by mouth daily.   atorvastatin (LIPITOR) 40 MG tablet Take 40 mg by mouth daily.   clopidogrel (PLAVIX) 75 MG tablet Take 75 mg by mouth daily.   gabapentin (NEURONTIN) 300 MG capsule Take 300 mg by mouth 2 (two) times daily.   gemfibrozil (LOPID) 600 MG tablet Take 600 mg by mouth 2 (two) times daily before a meal.   lisinopril (ZESTRIL) 20 MG tablet Take 20 mg by mouth daily.   metFORMIN (GLUCOPHAGE) 500 MG tablet Take 500 mg by mouth 2 (two) times daily with a meal.   methocarbamol (ROBAXIN) 500 MG tablet Take 500 mg by mouth 4 (four) times daily.   Omega-3 1000 MG CAPS Take by mouth.     Allergies:   Patient has no known allergies.   Social History   Socioeconomic History   Marital status: Single     Spouse name: Not on file   Number of children: Not on file   Years of education: Not on file   Highest education level: Not on file  Occupational History   Not on file  Tobacco Use   Smoking status: Every Day    Packs/day: .5    Types: Cigarettes   Smokeless tobacco: Never  Substance and Sexual Activity   Alcohol use: No   Drug use: No   Sexual activity: Not on file  Other Topics Concern   Not on file  Social History Narrative   Not on file   Social Determinants of Health   Financial Resource Strain: Not on file  Food Insecurity: Not on file  Transportation Needs: Not on file  Physical Activity: Not on file  Stress: Not on file  Social Connections: Not on file     Family History: The patient's family history includes Diabetes in her brother and sister; Hyperlipidemia in her brother, father, mother, and sister; Stroke in her mother. There is no history of Breast cancer.  ROS:   Please see the history of present illness.     All other systems reviewed and are negative.  EKGs/Labs/Other Studies Reviewed:    The following studies were reviewed today:   EKG:  EKG is  ordered today.  The ekg ordered today  demonstrates sinus rhythm, incomplete right bundle branch block  Recent Labs: No results found for requested labs within last 365 days.  Recent Lipid Panel    Component Value Date/Time   CHOL 168 04/22/2014 0451   TRIG 185 04/22/2014 0451   HDL 28 (L) 04/22/2014 0451   VLDL 37 04/22/2014 0451   LDLCALC 103 (H) 04/22/2014 0451     Risk Assessment/Calculations:             Physical Exam:    VS:  BP 128/66 (BP Location: Left Arm, Patient Position: Sitting, Cuff Size: Normal)   Pulse 67   Ht 5\' 9"  (1.753 m)   Wt 216 lb 9.6 oz (98.2 kg)   SpO2 94%   BMI 31.99 kg/m     Wt Readings from Last 3 Encounters:  08/20/22 216 lb 9.6 oz (98.2 kg)  06/13/19 222 lb (100.7 kg)  07/06/16 226 lb 4.8 oz (102.7 kg)     GEN:  Well nourished, well developed in no  acute distress HEENT: Normal NECK: No JVD; No carotid bruits CARDIAC: RRR, no murmurs, rubs, gallops RESPIRATORY:  Clear to auscultation without rales, wheezing or rhonchi  ABDOMEN: Soft, non-tender, non-distended MUSCULOSKELETAL:  No edema; No deformity  SKIN: Warm and dry NEUROLOGIC:  Alert and oriented x 3 PSYCHIATRIC:  Normal affect   ASSESSMENT:    1. Incomplete right bundle branch block   2. Primary hypertension   3. Smoking    PLAN:    In order of problems listed above:  Incomplete right bundle branch block, denies chest pain or shortness of breath.  Has several risk factors for cardiovascular disease.  Get echo to rule out any structural abnormalities.  If no significant abnormalities noted on echo, no indication for additional testing. Hypertension, BP controlled.  Continue Norvasc, lisinopril. Current smoker, smoking cessation advised.  Follow-up after echocardiogram      Medication Adjustments/Labs and Tests Ordered: Current medicines are reviewed at length with the patient today.  Concerns regarding medicines are outlined above.  Orders Placed This Encounter  Procedures   ECHOCARDIOGRAM COMPLETE   No orders of the defined types were placed in this encounter.   Patient Instructions  Medication Instructions:   Your physician recommends that you continue on your current medications as directed. Please refer to the Current Medication list given to you today.  *If you need a refill on your cardiac medications before your next appointment, please call your pharmacy*   Lab Work:  None Ordered  If you have labs (blood work) drawn today and your tests are completely normal, you will receive your results only by: MyChart Message (if you have MyChart) OR A paper copy in the mail If you have any lab test that is abnormal or we need to change your treatment, we will call you to review the results.   Testing/Procedures:  Your physician has requested that you  have an echocardiogram. Echocardiography is a painless test that uses sound waves to create images of your heart. It provides your doctor with information about the size and shape of your heart and how well your heart's chambers and valves are working. This procedure takes approximately one hour. There are no restrictions for this procedure. Please do NOT wear cologne, perfume, aftershave, or lotions (deodorant is allowed). Please arrive 15 minutes prior to your appointment time.    Follow-Up: At [facility], you and your health needs are our priority.  As part of our continuing mission to provide you  with exceptional heart care, we have created designated Provider Care Teams.  These Care Teams include your primary Cardiologist (physician) and Advanced Practice Providers (APPs -  Physician Assistants and Nurse Practitioners) who all work together to provide you with the care you need, when you need it.  We recommend signing up for the patient portal called "MyChart".  Sign up information is provided on this After Visit Summary.  MyChart is used to connect with patients for Virtual Visits (Telemedicine).  Patients are able to view lab/test results, encounter notes, upcoming appointments, etc.  Non-urgent messages can be sent to your provider as well.   To learn more about what you can do with MyChart, go to [email or web address].    Your next appointment:   2 month(s)  Provider:   You may see Debbe Odea, MD or one of the following Advanced Practice Providers on your designated Care Team:   Nicolasa Ducking, NP Eula Listen, PA-C Cadence Fransico Michael, PA-C Charlsie Quest, NP    Signed, Debbe Odea, MD  08/20/2022 11:03 AM    [facility]

## 2022-08-20 NOTE — Patient Instructions (Signed)
Medication Instructions:   Your physician recommends that you continue on your current medications as directed. Please refer to the Current Medication list given to you today.  *If you need a refill on your cardiac medications before your next appointment, please call your pharmacy*   Lab Work:  None Ordered  If you have labs (blood work) drawn today and your tests are completely normal, you will receive your results only by: MyChart Message (if you have MyChart) OR A paper copy in the mail If you have any lab test that is abnormal or we need to change your treatment, we will call you to review the results.   Testing/Procedures:  Your physician has requested that you have an echocardiogram. Echocardiography is a painless test that uses sound waves to create images of your heart. It provides your doctor with information about the size and shape of your heart and how well your heart's chambers and valves are working. This procedure takes approximately one hour. There are no restrictions for this procedure. Please do NOT wear cologne, perfume, aftershave, or lotions (deodorant is allowed). Please arrive 15 minutes prior to your appointment time.    Follow-Up: At [facility], you and your health needs are our priority.  As part of our continuing mission to provide you with exceptional heart care, we have created designated Provider Care Teams.  These Care Teams include your primary Cardiologist (physician) and Advanced Practice Providers (APPs -  Physician Assistants and Nurse Practitioners) who all work together to provide you with the care you need, when you need it.  We recommend signing up for the patient portal called "MyChart".  Sign up information is provided on this After Visit Summary.  MyChart is used to connect with patients for Virtual Visits (Telemedicine).  Patients are able to view lab/test results, encounter notes, upcoming appointments, etc.  Non-urgent messages can  be sent to your provider as well.   To learn more about what you can do with MyChart, go to [email or web address].    Your next appointment:   2 month(s)  Provider:   You may see Debbe Odea, MD or one of the following Advanced Practice Providers on your designated Care Team:   Nicolasa Ducking, NP Eula Listen, PA-C Cadence Fransico Michael, PA-C Charlsie Quest, NP

## 2022-08-25 NOTE — Addendum Note (Signed)
Addended by: Auburn Bilberry D on: 08/25/2022 09:40 AM   Modules accepted: Orders

## 2022-09-05 IMAGING — MG MM DIGITAL SCREENING BILAT W/ TOMO AND CAD
8 series · 8 of 24 positions shown · non-contrast
Comparison: Previous exam(s).

CLINICAL DATA: Screening.

EXAM:
DIGITAL SCREENING BILATERAL MAMMOGRAM WITH TOMOSYNTHESIS AND CAD
TECHNIQUE: Bilateral screening digital craniocaudal and mediolateral oblique
mammograms were obtained. Bilateral screening digital breast
tomosynthesis was performed. The images were evaluated with
computer-aided detection.

[L CC synth-2D]
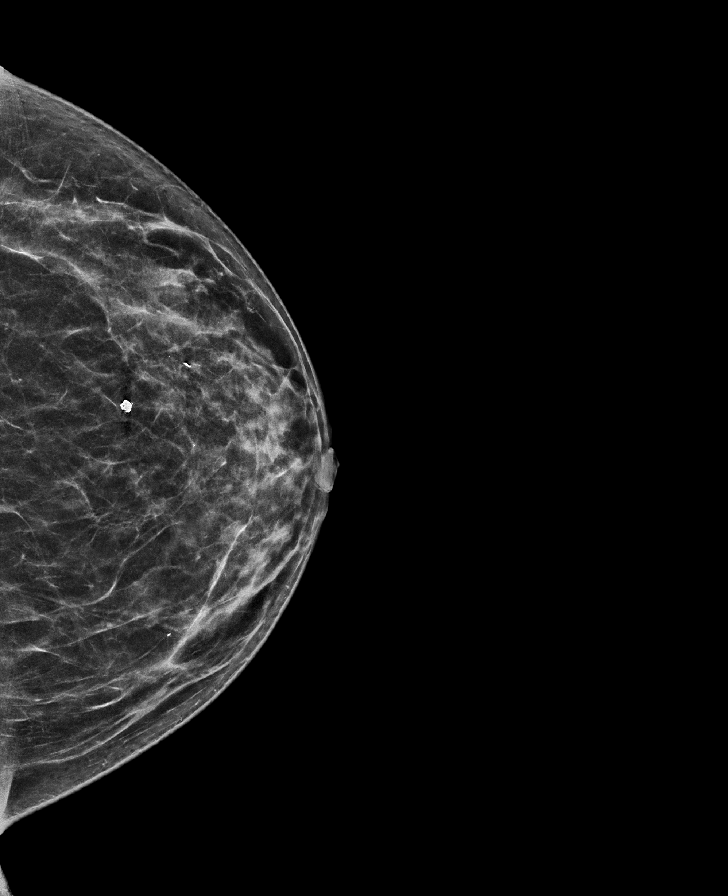

[R CC synth-2D]
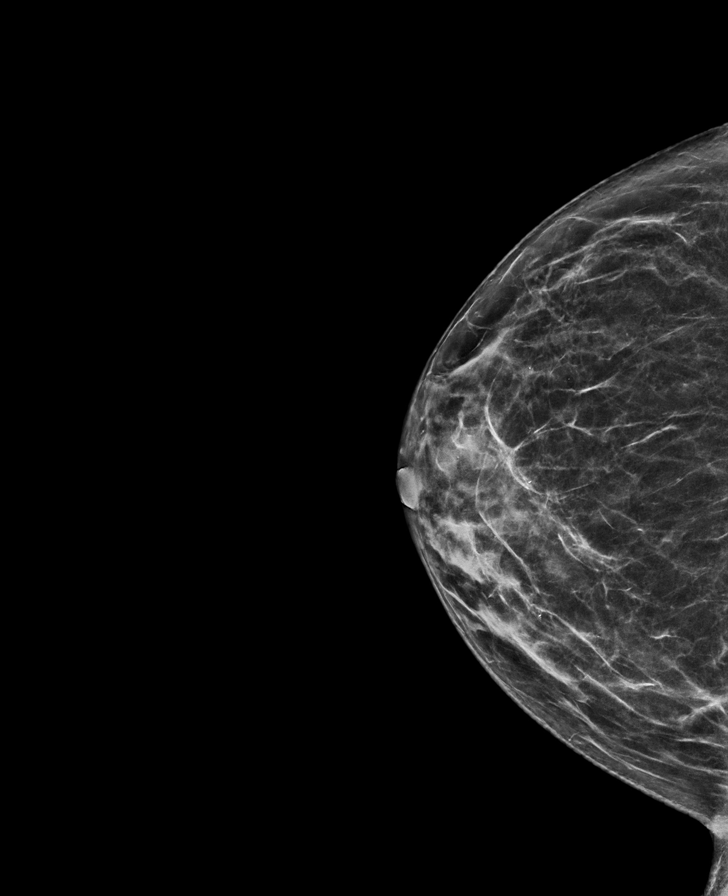

[R MLO synth-2D]
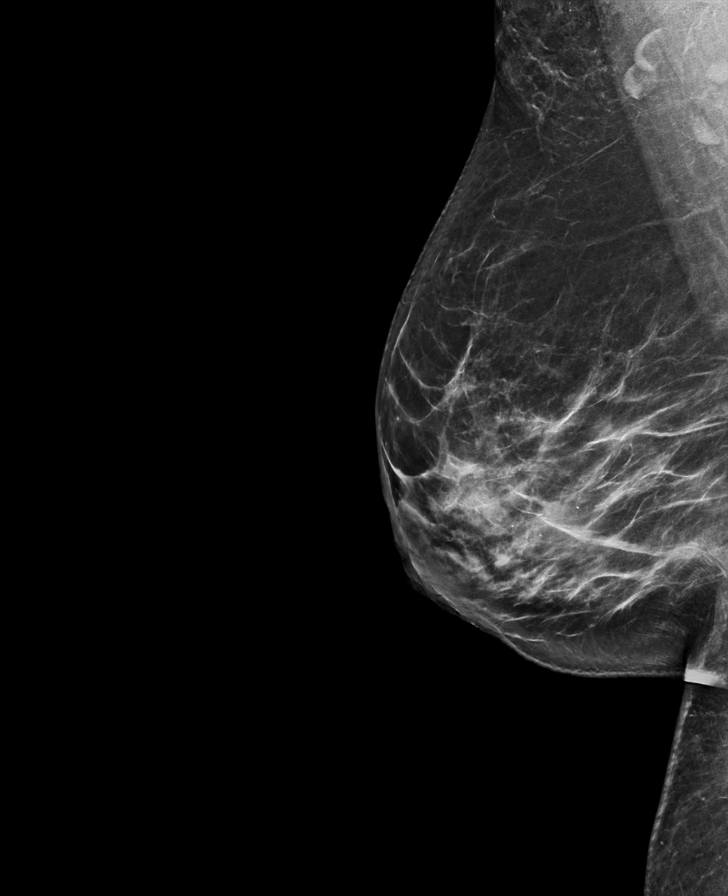

[L MLO synth-2D]
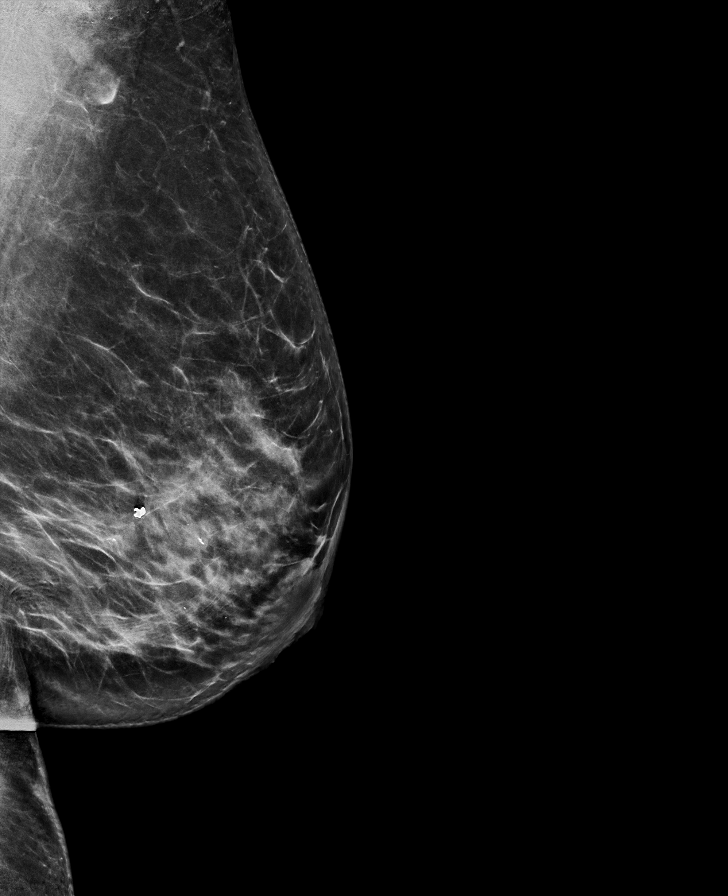

[R CC tomo · tomo slice 33/65.0]
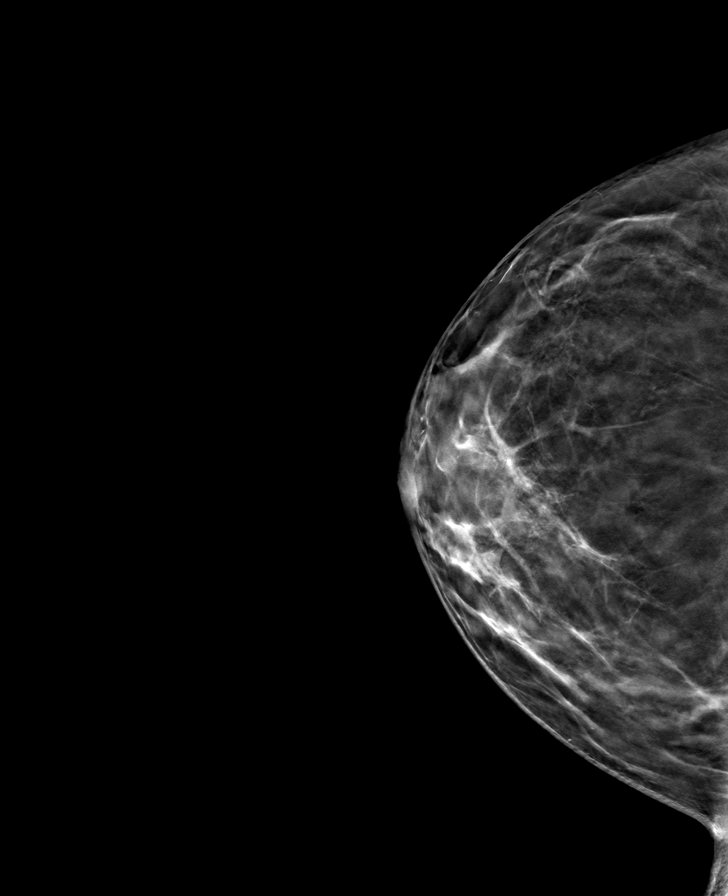

[L CC tomo · tomo slice 33/66.0]
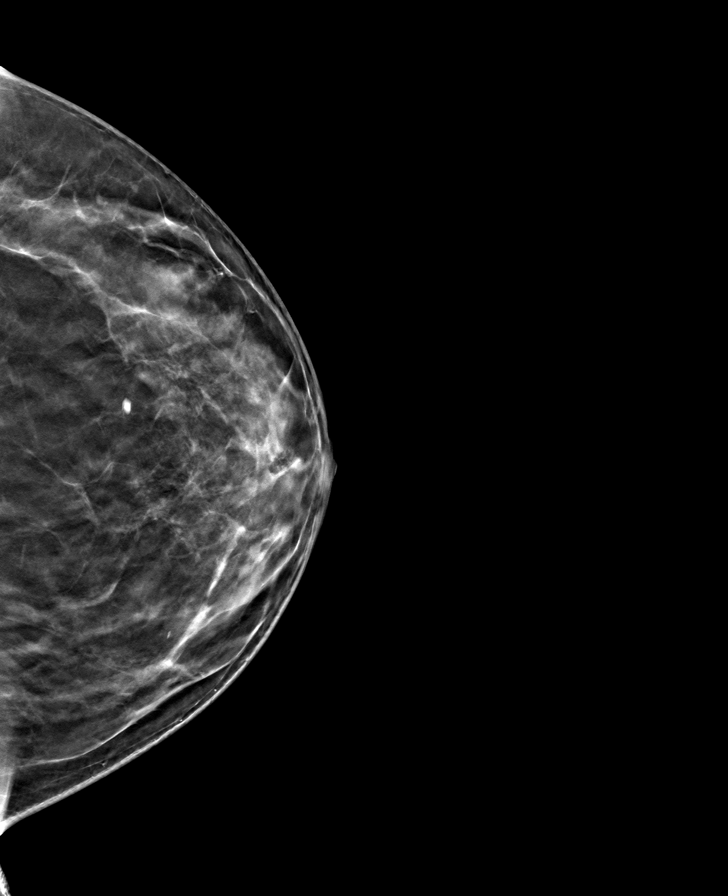

[R MLO tomo · tomo slice 39/76.0]
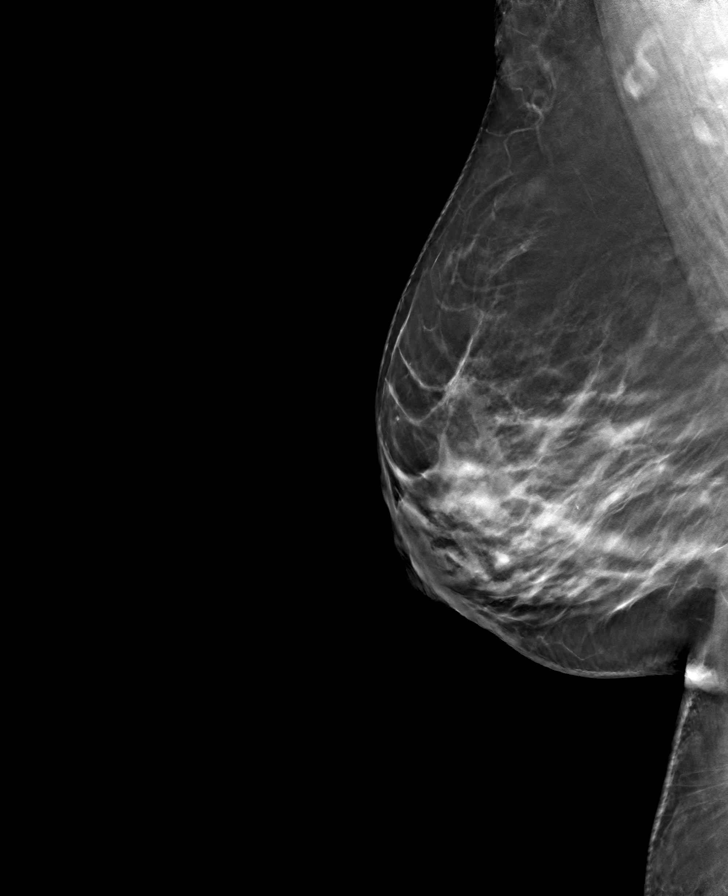

[L MLO tomo · tomo slice 39/76.0]
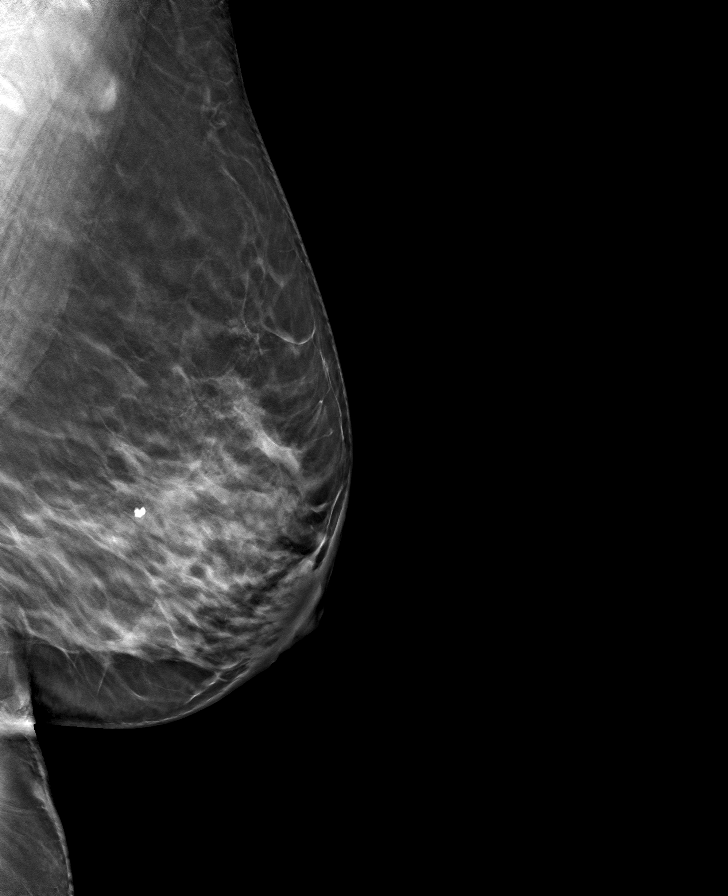

[8 of 24 positions shown; findings below may reference images not displayed]

ACR Breast Density Category c: The breast tissue is heterogeneously
dense, which may obscure small masses.
FINDINGS: There are no findings suspicious for malignancy.
IMPRESSION: No mammographic evidence of malignancy. A result letter of this
screening mammogram will be mailed directly to the patient.

RECOMMENDATION:
Screening mammogram in one year. (Code:Q3-W-BC3)

BI-RADS CATEGORY  1: Negative.

## 2022-09-17 ENCOUNTER — Ambulatory Visit: Payer: 59 | Attending: Cardiology

## 2022-09-17 DIAGNOSIS — I4510 Unspecified right bundle-branch block: Secondary | ICD-10-CM | POA: Diagnosis not present

## 2022-09-17 LAB — ECHOCARDIOGRAM COMPLETE
AR max vel: 2.84 cm2
AV Area VTI: 2.93 cm2
AV Area mean vel: 2.79 cm2
AV Mean grad: 4 mmHg
AV Peak grad: 7.5 mmHg
Ao pk vel: 1.37 m/s
Area-P 1/2: 3.21 cm2
S' Lateral: 3.6 cm

## 2022-10-20 ENCOUNTER — Encounter: Payer: Self-pay | Admitting: Cardiology

## 2022-10-20 ENCOUNTER — Ambulatory Visit: Payer: 59 | Attending: Cardiology | Admitting: Cardiology

## 2022-10-20 VITALS — BP 138/64 | HR 58 | Ht 69.0 in | Wt 223.8 lb

## 2022-10-20 DIAGNOSIS — I4510 Unspecified right bundle-branch block: Secondary | ICD-10-CM | POA: Diagnosis not present

## 2022-10-20 DIAGNOSIS — I10 Essential (primary) hypertension: Secondary | ICD-10-CM | POA: Diagnosis not present

## 2022-10-20 NOTE — Patient Instructions (Signed)
Medication Instructions:  Your physician recommends that you continue on your current medications as directed. Please refer to the Current Medication list given to you today.  *If you need a refill on your cardiac medications before your next appointment, please call your pharmacy*  Lab Work: -None ordered  Testing/Procedures: -None ordered  Follow-Up: At [facility], you and your health needs are our priority.  As part of our continuing mission to provide you with exceptional heart care, we have created designated Provider Care Teams.  These Care Teams include your primary Cardiologist (physician) and Advanced Practice Providers (APPs -  Physician Assistants and Nurse Practitioners) who all work together to provide you with the care you need, when you need it.  Your next appointment:   As needed   Provider:   You may see Brian Agbor-Etang, MD or one of the following Advanced Practice Providers on your designated Care Team:   Christopher Berge, NP Ryan Dunn, PA-C Cadence Furth, PA-C Sheri Hammock, NP    Other Instructions -None  

## 2022-10-20 NOTE — Progress Notes (Signed)
Cardiology Office Note:    Date:  10/20/2022   ID:  Judith Parker, DOB May 22, 1955, MRN [id number]  PCP:  Oswaldo Conroy, MD    [facility] Providers Cardiologist:  Debbe Odea, MD     Referring MD: Oswaldo Conroy, MD   Chief Complaint  Patient presents with   Follow-up    Follow up after echo.  Patient denies new or acute cardiac problems/concerns today.      History of Present Illness:    Judith Parker is a 67 y.o. female with a hx of hypertension, hyperlipidemia, diabetes, former smoker x 40+ years who presents for follow-up.  Last seen due to right bundle branch block.  Echocardiogram was obtained to evaluate any significant structural abnormalities.  Denies chest pain or shortness of breath.  She quit smoking, states breathing better with quitting smoking.  Overall feels well, no concerns at this time, presents for echocardiogram results.   Past Medical History:  Diagnosis Date   Diabetes mellitus without complication (HCC)    Hyperlipidemia    Hypertension    TIA (transient ischemic attack)     Past Surgical History:  Procedure Laterality Date   ABDOMINAL HYSTERECTOMY      Current Medications: Current Meds  Medication Sig   amLODipine (NORVASC) 10 MG tablet Take 10 mg by mouth daily.   aspirin 81 MG tablet Take 81 mg by mouth daily.   atorvastatin (LIPITOR) 40 MG tablet Take 40 mg by mouth daily.   clopidogrel (PLAVIX) 75 MG tablet Take 75 mg by mouth daily.   gabapentin (NEURONTIN) 300 MG capsule Take 300 mg by mouth 2 (two) times daily.   gemfibrozil (LOPID) 600 MG tablet Take 600 mg by mouth 2 (two) times daily before a meal.   lisinopril (ZESTRIL) 20 MG tablet Take 20 mg by mouth daily.   metFORMIN (GLUCOPHAGE) 500 MG tablet Take 500 mg by mouth 2 (two) times daily with a meal.   methocarbamol (ROBAXIN) 500 MG tablet Take 500 mg by mouth 4 (four) times daily.   Omega-3 1000 MG CAPS Take by mouth.     Allergies:   Patient has no  known allergies.   Social History   Socioeconomic History   Marital status: Single    Spouse name: Not on file   Number of children: Not on file   Years of education: Not on file   Highest education level: Not on file  Occupational History   Not on file  Tobacco Use   Smoking status: Every Day    Packs/day: .5    Types: Cigarettes   Smokeless tobacco: Never  Substance and Sexual Activity   Alcohol use: No   Drug use: No   Sexual activity: Not on file  Other Topics Concern   Not on file  Social History Narrative   Not on file   Social Determinants of Health   Financial Resource Strain: Not on file  Food Insecurity: Not on file  Transportation Needs: Not on file  Physical Activity: Not on file  Stress: Not on file  Social Connections: Not on file     Family History: The patient's family history includes Diabetes in her brother and sister; Hyperlipidemia in her brother, father, mother, and sister; Stroke in her mother. There is no history of Breast cancer.  ROS:   Please see the history of present illness.     All other systems reviewed and are negative.  EKGs/Labs/Other Studies Reviewed:    The  following studies were reviewed today:   EKG:  EKG not ordered today.    Recent Labs: No results found for requested labs within last 365 days.  Recent Lipid Panel    Component Value Date/Time   CHOL 168 04/22/2014 0451   TRIG 185 04/22/2014 0451   HDL 28 (L) 04/22/2014 0451   VLDL 37 04/22/2014 0451   LDLCALC 103 (H) 04/22/2014 0451     Risk Assessment/Calculations:             Physical Exam:    VS:  BP 138/64 (BP Location: Left Arm, Patient Position: Sitting, Cuff Size: Large)   Pulse (!) 58   Ht 5\' 9"  (1.753 m)   Wt 223 lb 12.8 oz (101.5 kg)   SpO2 97%   BMI 33.05 kg/m     Wt Readings from Last 3 Encounters:  10/20/22 223 lb 12.8 oz (101.5 kg)  08/20/22 216 lb 9.6 oz (98.2 kg)  06/13/19 222 lb (100.7 kg)     GEN:  Well nourished, well  developed in no acute distress HEENT: Normal NECK: No JVD; No carotid bruits CARDIAC: RRR, no murmurs, rubs, gallops RESPIRATORY:  Clear to auscultation without rales, wheezing or rhonchi  ABDOMEN: Soft, non-tender, non-distended MUSCULOSKELETAL:  No edema; No deformity  SKIN: Warm and dry NEUROLOGIC:  Alert and oriented x 3 PSYCHIATRIC:  Normal affect   ASSESSMENT:    1. Incomplete right bundle branch block   2. Primary hypertension    PLAN:    In order of problems listed above:  Incomplete right bundle branch block, denies chest pain or shortness of breath.  Echo 5/24 EF 60 to 65%, diastolic function normal.  Patient made aware of results, no indication for additional testing at this time.. Hypertension, BP controlled.  Continue Norvasc 10 mg daily, lisinopril 20 mg daily.  Follow-up as needed.      Medication Adjustments/Labs and Tests Ordered: Current medicines are reviewed at length with the patient today.  Concerns regarding medicines are outlined above.  No orders of the defined types were placed in this encounter.  No orders of the defined types were placed in this encounter.   Patient Instructions  Medication Instructions:  Your physician recommends that you continue on your current medications as directed. Please refer to the Current Medication list given to you today.  *If you need a refill on your cardiac medications before your next appointment, please call your pharmacy*  Lab Work: -None ordered  Testing/Procedures: -None ordered  Follow-Up: At [facility], you and your health needs are our priority.  As part of our continuing mission to provide you with exceptional heart care, we have created designated Provider Care Teams.  These Care Teams include your primary Cardiologist (physician) and Advanced Practice Providers (APPs -  Physician Assistants and Nurse Practitioners) who all work together to provide you with the care you need, when you need  it.  Your next appointment:   As needed   Provider:   You may see Debbe Odea, MD or one of the following Advanced Practice Providers on your designated Care Team:   Nicolasa Ducking, NP Eula Listen, PA-C Cadence Fransico Michael, PA-C Charlsie Quest, NP    Other Instructions -None    Signed, Debbe Odea, MD  10/20/2022 10:20 AM    [facility]

## 2022-11-16 ENCOUNTER — Other Ambulatory Visit: Payer: Self-pay | Admitting: Family Medicine

## 2022-11-16 DIAGNOSIS — Z1231 Encounter for screening mammogram for malignant neoplasm of breast: Secondary | ICD-10-CM

## 2023-07-09 ENCOUNTER — Telehealth: Payer: Self-pay | Admitting: Acute Care

## 2023-07-09 DIAGNOSIS — Z122 Encounter for screening for malignant neoplasm of respiratory organs: Secondary | ICD-10-CM

## 2023-07-09 DIAGNOSIS — F17210 Nicotine dependence, cigarettes, uncomplicated: Secondary | ICD-10-CM

## 2023-07-09 DIAGNOSIS — Z87891 Personal history of nicotine dependence: Secondary | ICD-10-CM

## 2023-07-09 NOTE — Telephone Encounter (Signed)
 Lung Cancer Screening Narrative/Criteria Questionnaire (Cigarette Smokers Only- No Cigars/Pipes/vapes)   Judith Parker   SDMV:07/29/2023 at 11:30 with Orpha Bur        08-31-1955   LDCT: 07/30/2023 at 10:30 at [facility]    68 y.o.   Phone: [phone]  Lung Screening Narrative (confirm age 12-77 yrs Medicare / 50-80 yrs Private pay insurance)   Insurance information:[employer]   Referring Provider:Dr. Tonia Ghent   This screening involves an initial phone call with a team member from our program. It is called a shared decision making visit. The initial meeting is required by  insurance and Medicare to make sure you understand the program. This appointment takes about 15-20 minutes to complete. You will complete the screening scan at your scheduled date/time.  This scan takes about 5-10 minutes to complete. You can eat and drink normally before and after the scan.  Criteria questions for Lung Cancer Screening:   Are you a current or former smoker? Current Age began smoking: 68yo   If you are a former smoker, what year did you quit smoking? N/A(within 15 yrs)   To calculate your smoking history, I need an accurate estimate of how many packs of cigarettes you smoked per day and for how many years. (Not just the number of PPD you are now smoking)   Years smoking 40 x Packs per day 1/2 = Pack years 20   (at least 20 pack yrs)   (Make sure they understand that we need to know how much they have smoked in the past, not just the number of PPD they are smoking now)  Do you have a personal history of cancer?  No    Do you have a family history of cancer? Yes  (cancer type and and relative) Mother - unsure of type   Are you coughing up blood?  No  Have you had unexplained weight loss of 15 lbs or more in the last 6 months? No  It looks like you meet all criteria.  When would be a good time for [place] to schedule you for this screening?   Additional information: N/A

## 2023-07-13 ENCOUNTER — Encounter: Payer: Self-pay | Admitting: Family Medicine

## 2023-07-29 ENCOUNTER — Encounter: Admitting: Adult Health

## 2023-07-30 ENCOUNTER — Ambulatory Visit
Admission: RE | Admit: 2023-07-30 | Discharge: 2023-07-30 | Disposition: A | Discharge status: Home / Self Care | Source: Ambulatory Visit | Attending: Acute Care | Admitting: Acute Care

## 2023-07-30 DIAGNOSIS — Z122 Encounter for screening for malignant neoplasm of respiratory organs: Secondary | ICD-10-CM | POA: Diagnosis present

## 2023-07-30 DIAGNOSIS — Z87891 Personal history of nicotine dependence: Secondary | ICD-10-CM | POA: Insufficient documentation

## 2023-07-30 DIAGNOSIS — F17210 Nicotine dependence, cigarettes, uncomplicated: Secondary | ICD-10-CM | POA: Insufficient documentation

## 2023-08-30 ENCOUNTER — Other Ambulatory Visit: Payer: Self-pay

## 2023-08-30 DIAGNOSIS — Z87891 Personal history of nicotine dependence: Secondary | ICD-10-CM

## 2023-08-30 DIAGNOSIS — Z122 Encounter for screening for malignant neoplasm of respiratory organs: Secondary | ICD-10-CM

## 2023-08-30 DIAGNOSIS — F17210 Nicotine dependence, cigarettes, uncomplicated: Secondary | ICD-10-CM

## 2024-01-13 ENCOUNTER — Other Ambulatory Visit: Payer: Self-pay | Admitting: Family Medicine

## 2024-01-13 DIAGNOSIS — Z1231 Encounter for screening mammogram for malignant neoplasm of breast: Secondary | ICD-10-CM

## 2024-02-08 ENCOUNTER — Ambulatory Visit
Admission: RE | Admit: 2024-02-08 | Discharge: 2024-02-08 | Disposition: A | Discharge status: Home / Self Care | Source: Ambulatory Visit | Attending: Family Medicine | Admitting: Family Medicine

## 2024-02-08 DIAGNOSIS — Z1231 Encounter for screening mammogram for malignant neoplasm of breast: Secondary | ICD-10-CM | POA: Insufficient documentation
